# Patient Record
Sex: Female | Born: 1970 | Hispanic: Yes | Marital: Married | Smoking: Never smoker
Health system: Southern US, Community
[De-identification: ages and names within clinical notes are randomized; demographics above are authoritative.]

## PROBLEM LIST (undated history)

## (undated) DIAGNOSIS — E119 Type 2 diabetes mellitus without complications: Secondary | ICD-10-CM

## (undated) HISTORY — DX: Type 2 diabetes mellitus without complications: E11.9

---

## 2012-05-04 ENCOUNTER — Ambulatory Visit: Payer: Self-pay | Admitting: Primary Care

## 2016-05-26 ENCOUNTER — Ambulatory Visit: Payer: Self-pay

## 2016-06-16 ENCOUNTER — Ambulatory Visit: Payer: Self-pay | Attending: Oncology

## 2020-06-11 ENCOUNTER — Ambulatory Visit: Payer: Self-pay | Attending: Oncology

## 2020-06-11 ENCOUNTER — Other Ambulatory Visit: Payer: Self-pay

## 2020-06-11 ENCOUNTER — Ambulatory Visit: Payer: Self-pay

## 2020-06-11 VITALS — BP 133/82 | HR 92 | Temp 99.0°F | Ht 65.0 in | Wt 225.0 lb

## 2020-06-11 DIAGNOSIS — N63 Unspecified lump in unspecified breast: Secondary | ICD-10-CM

## 2020-06-11 NOTE — Progress Notes (Addendum)
  Subjective:     Patient ID: Priscilla Noble, female   DOB: [birth year], 49 y.o.   MRN: [id number]  HPI   Review of Systems     Objective:   Physical Exam Chest:  Breasts:     Right: Mass and tenderness present. No swelling, bleeding, inverted nipple, nipple discharge or skin change.     Left: No swelling, bleeding, inverted nipple, mass, nipple discharge, skin change or tenderness.        Comments: Tender 65mm nodule right breast 5 o'clock       Assessment:     49 year old Hispanic patient present with complaint of right breast mass x 2 months.  Patient screened, and meets B[facility] eligibility.  Patient does not have insurance, Medicare or Medicaid. Instructed patient on breast self awareness using teach back method.  Clinical breast exam revealed a 26mm nodule 5 o'clock right breast.  Tender on palpation.  Risk Assessment   No risk assessment data for the current encounter  Risk Scores      06/11/2020   Last edited by: Scarlett Presto, RN   5-year risk: 0.6 %   Lifetime risk: 5.8 %            Plan:     Scheduled for diagnostic mammogram, and ultrasound 06/25/20 at 11:00 per Deneice Breedlove.

## 2020-06-25 ENCOUNTER — Ambulatory Visit
Admission: RE | Admit: 2020-06-25 | Discharge: 2020-06-25 | Disposition: A | Discharge status: Home / Self Care | Payer: Self-pay | Source: Ambulatory Visit | Attending: Oncology | Admitting: Oncology

## 2020-06-25 ENCOUNTER — Other Ambulatory Visit: Payer: Self-pay

## 2020-06-25 DIAGNOSIS — N63 Unspecified lump in unspecified breast: Secondary | ICD-10-CM

## 2020-07-28 NOTE — Progress Notes (Signed)
Letter mailed from [facility] to notify of normal mammogram results.  Patient to return in one year for annual screening.  Copy to [facility]. 

## 2022-01-30 IMAGING — MG DIGITAL DIAGNOSTIC BILAT W/ TOMO W/ CAD
6 of 10 series · 6 of 30 positions shown · non-contrast
Comparison: Mammogram dated 05/04/2012.

CLINICAL DATA: Here for evaluation of a palpable and tender area of
concern felt by the patient in the right breast for the past 2
months.

EXAM:
DIGITAL DIAGNOSTIC BILATERAL MAMMOGRAM WITH CAD AND TOMO
ULTRASOUND RIGHT BREAST

[R ML synth-2D]
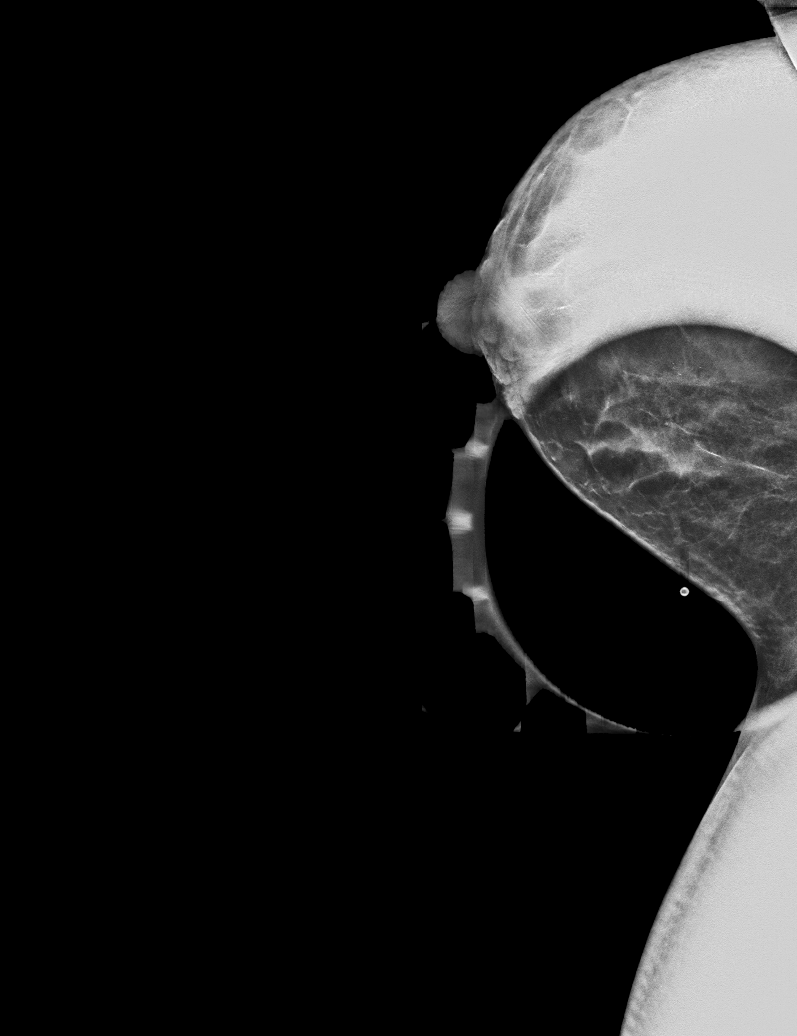

[L MLO synth-2D]
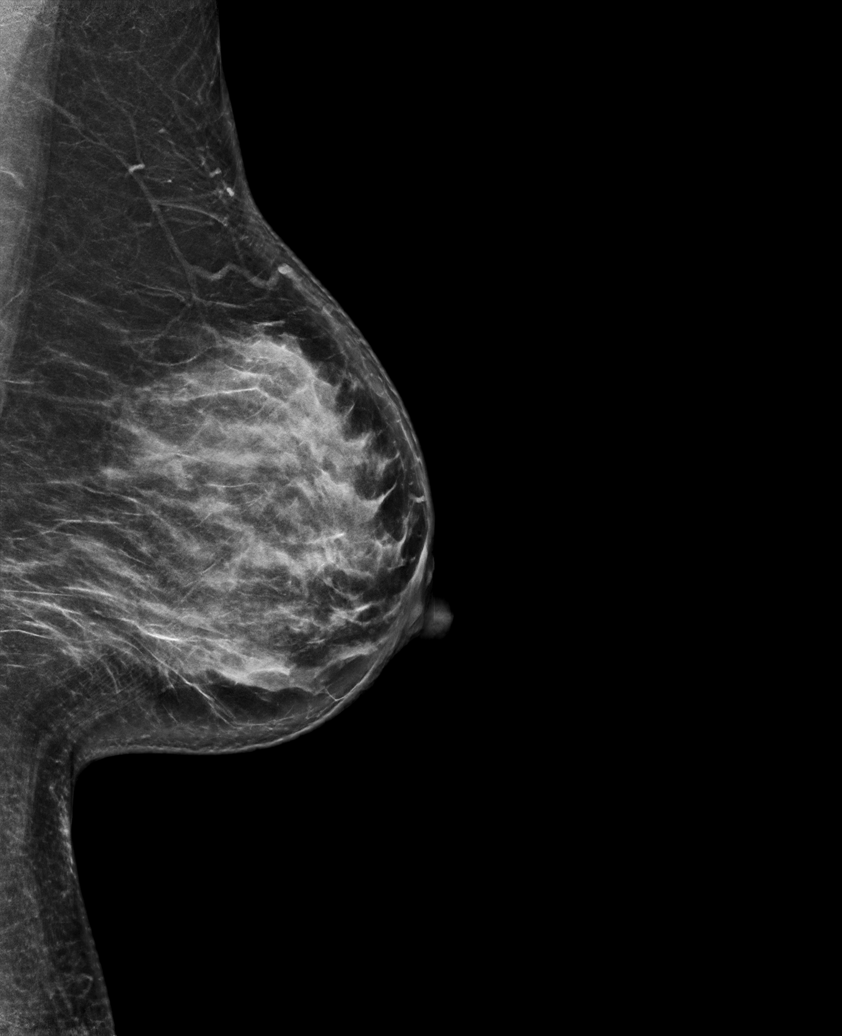

[R MLO synth-2D]
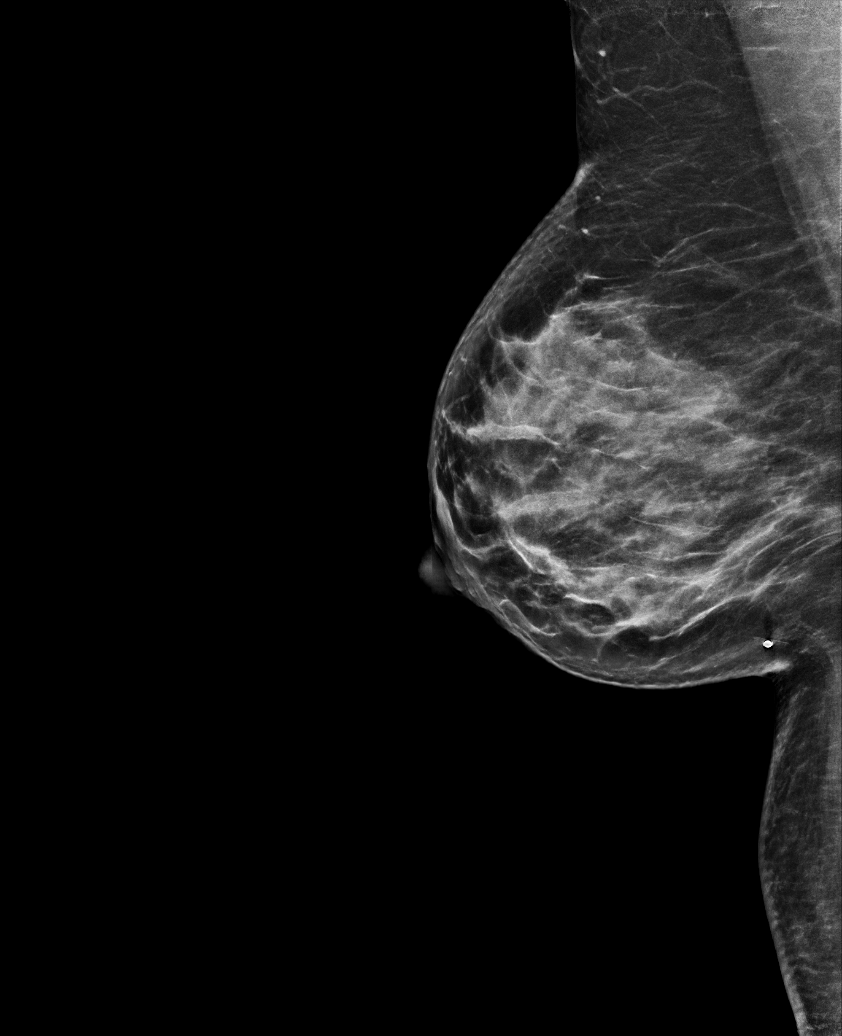

[L CC synth-2D]
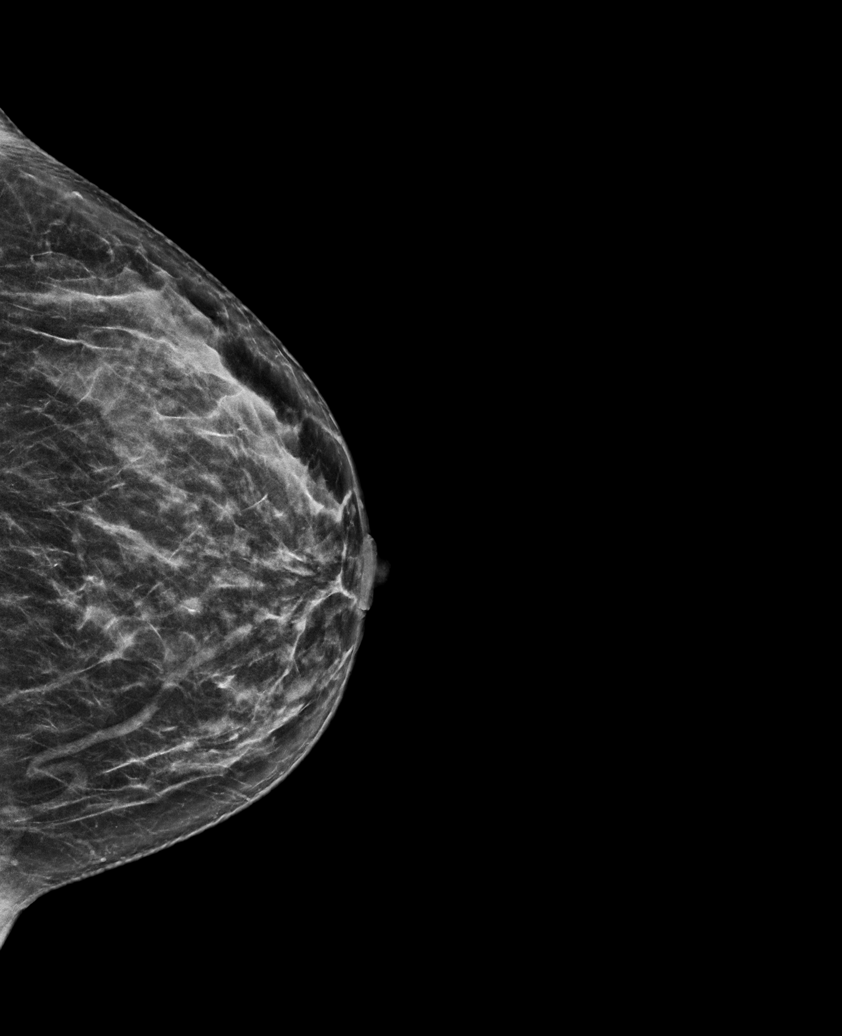

[R CC synth-2D]
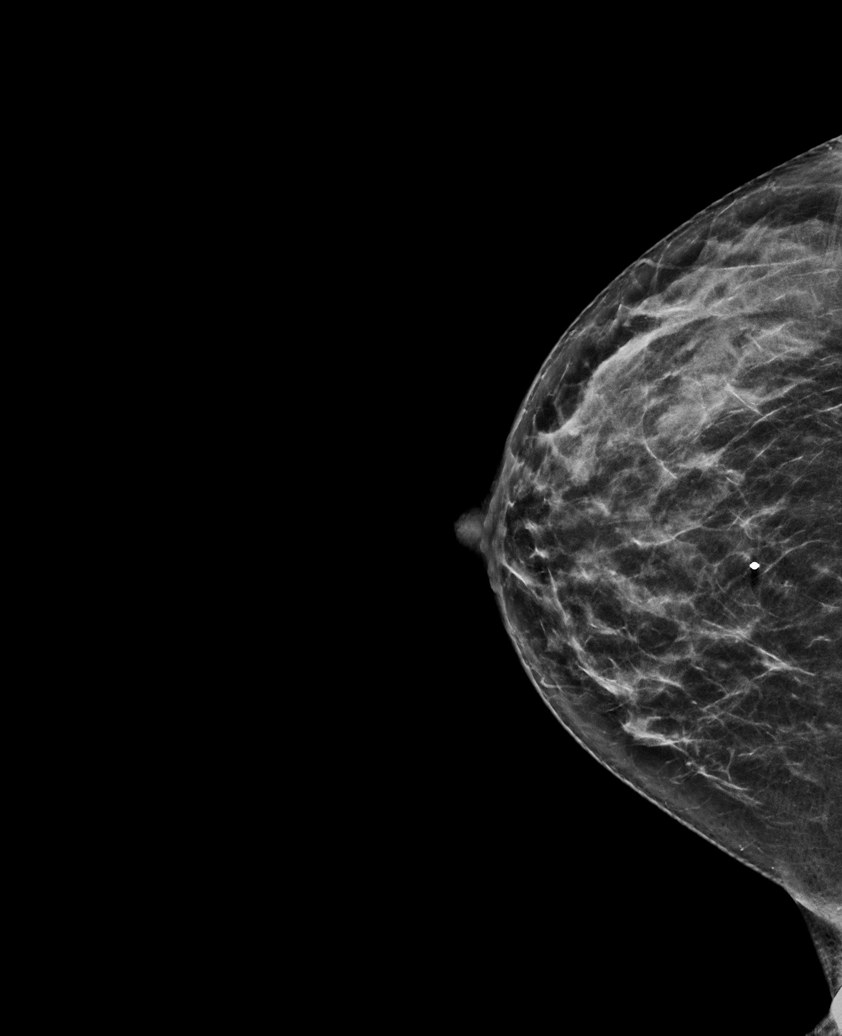

[L CC tomo · tomo slice 33/64.0]
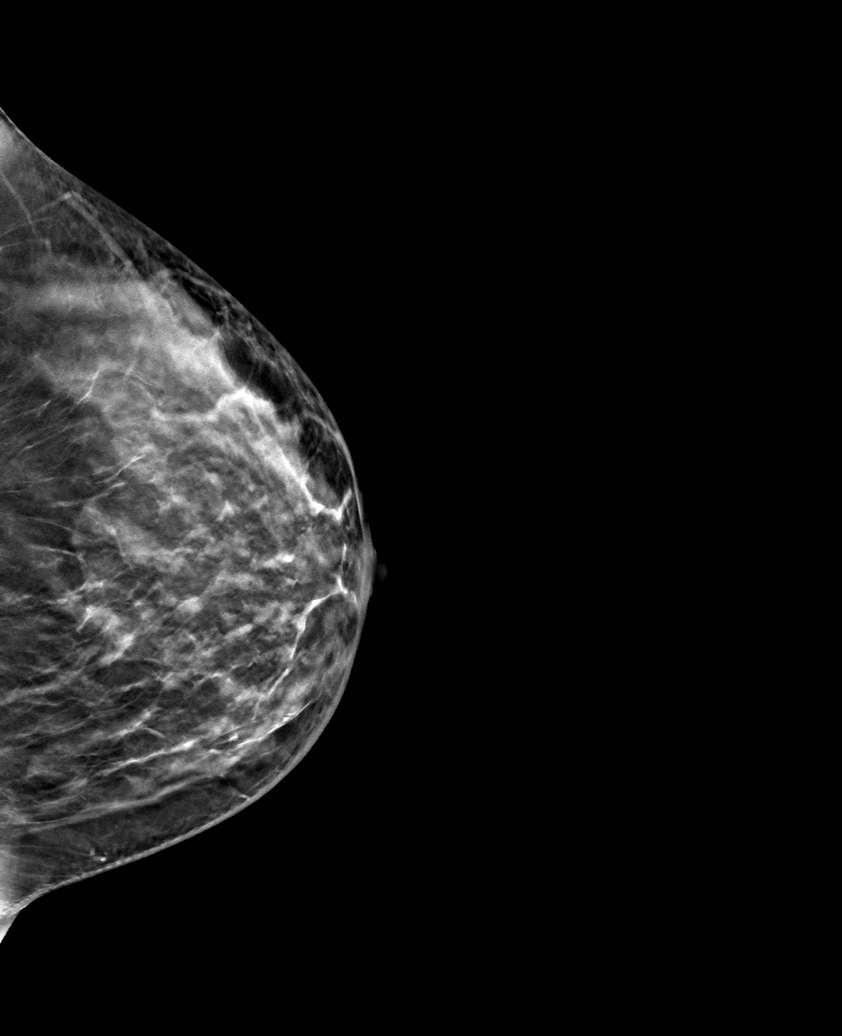

[6 of 30 positions shown; findings below may reference images not displayed]

ACR Breast Density Category c: The breast tissue is heterogeneously
dense, which may obscure small masses.
FINDINGS: Spot compression was obtained over the palpable area of concern in
the right breast. No suspicious mammographic finding is identified
in this area. No suspicious mass, microcalcification, or other
finding is identified in either breast.

Mammographic images were processed with CAD.

Targeted right breast ultrasound was performed by the sonographer
and the physician in the palpable area of concern from the 5 o'clock
to the 7 o'clock position. No suspicious solid or cystic mass is
identified. Normal appearing fibroglandular tissue is seen.
IMPRESSION: No findings to explain the palpable area of concern. No evidence of
malignancy in either breast.

RECOMMENDATION:
Any further workup of the patient's symptoms should be based on the
clinical assessment. Recommend routine annual screening mammogram in
1 year.

I have discussed the findings and recommendations with the patient.
If applicable, a reminder letter will be sent to the patient
regarding the next appointment.

BI-RADS CATEGORY  1: Negative.

## 2022-08-26 ENCOUNTER — Encounter: Payer: Self-pay | Admitting: Internal Medicine

## 2022-08-26 ENCOUNTER — Telehealth: Payer: Self-pay | Admitting: *Deleted

## 2022-09-10 ENCOUNTER — Other Ambulatory Visit: Payer: Self-pay

## 2022-09-10 DIAGNOSIS — Z1231 Encounter for screening mammogram for malignant neoplasm of breast: Secondary | ICD-10-CM

## 2022-10-04 ENCOUNTER — Ambulatory Visit: Payer: Self-pay | Attending: Hematology and Oncology | Admitting: Hematology and Oncology

## 2022-10-04 ENCOUNTER — Ambulatory Visit
Admission: RE | Admit: 2022-10-04 | Discharge: 2022-10-04 | Disposition: A | Discharge status: Home / Self Care | Payer: Self-pay | Source: Ambulatory Visit | Attending: Obstetrics and Gynecology | Admitting: Obstetrics and Gynecology

## 2022-10-04 VITALS — BP 154/88 | Wt 230.2 lb

## 2022-10-04 DIAGNOSIS — Z1231 Encounter for screening mammogram for malignant neoplasm of breast: Secondary | ICD-10-CM | POA: Insufficient documentation

## 2022-10-04 NOTE — Progress Notes (Signed)
[name] is a 52 y.o. female who presents to [facility] clinic today with no complaints.    Pap Smear: Pap not smear completed today. Last Pap smear was 2024 at [facility] clinic and was normal. Per patient has  ASCUS/ HPV-  of an abnormal Pap smear. Last Pap smear result is available in Epic.   Physical exam: Breasts Breasts symmetrical. No skin abnormalities bilateral breasts. No nipple retraction bilateral breasts. No nipple discharge bilateral breasts. No lymphadenopathy. No lumps palpated bilateral breasts. MS DIGITAL DIAG TOMO BILAT  Result Date: 06/25/2020 CLINICAL DATA:  Here for evaluation of a palpable and tender area of concern felt by the patient in the right breast for the past 2 months. EXAM: DIGITAL DIAGNOSTIC BILATERAL MAMMOGRAM WITH CAD AND TOMO ULTRASOUND RIGHT BREAST COMPARISON:  Mammogram dated 05/04/2012. ACR Breast Density Category c: The breast tissue is heterogeneously dense, which may obscure small masses. FINDINGS: Spot compression was obtained over the palpable area of concern in the right breast. No suspicious mammographic finding is identified in this area. No suspicious mass, microcalcification, or other finding is identified in either breast. Mammographic images were processed with CAD. Targeted right breast ultrasound was performed by the sonographer and the physician in the palpable area of concern from the 5 o'clock to the 7 o'clock position. No suspicious solid or cystic mass is identified. Normal appearing fibroglandular tissue is seen. IMPRESSION: No findings to explain the palpable area of concern. No evidence of malignancy in either breast. RECOMMENDATION: Any further workup of the patient's symptoms should be based on the clinical assessment. Recommend routine annual screening mammogram in 1 year. I have discussed the findings and recommendations with the patient. If applicable, a reminder letter will be sent to the patient regarding the next appointment.  BI-RADS CATEGORY  1: Negative. Electronically Signed   By: Romona Curls M.D.   On: 06/25/2020 13:09          Pelvic/Bimanual Pap is not indicated today    Smoking History: Patient has never smoked and was not referred to quit line.    Patient Navigation: Patient education provided. Access to services provided for patient through B[facility]CP program. Priscilla Noble interpreter provided. No transportation provided   Colorectal Cancer Screening: Per patient has never had colonoscopy completed No complaints today. FIT test done 03/24 per Priscilla Noble.    Breast and Cervical Cancer Risk Assessment: Patient does not have family history of breast cancer, known genetic mutations, or radiation treatment to the chest before age 10. Patient does not have history of cervical dysplasia, immunocompromised, or DES exposure in-utero.  Risk Assessment   No risk assessment data for the current encounter  Risk Scores       06/11/2020   Last edited by: Priscilla Presto, RN   5-year risk: 0.6 %   Lifetime risk: 5.8 %            A: B[facility]CP exam without pap smear No complaints with benign exam.   P: [facility]. Appointment scheduled 10/04/22.  Ilda Basset A, NP 10/04/2022 2:00 PM

## 2022-10-04 NOTE — Patient Instructions (Addendum)
Taught Priscilla Noble about self breast awareness and gave educational materials to take home. Patient did not need a Pap smear today due to last Pap smear was in 2024 per patient. Let her know [facility] will cover Pap smears every 5 years unless has a history of abnormal Pap smears. [facility]. Appointment scheduled for 10/04/22. Patient aware of appointment and will be there. Let patient know will follow up with her within the next couple weeks with results. Priscilla Noble verbalized understanding.  Pascal Lux, NP 2:01 PM
